# Patient Record
Sex: Male | Born: 1995 | Race: White | Hispanic: No | Marital: Single | Smoking: Current every day smoker
Health system: Southern US, Community
[De-identification: ages and names within clinical notes are randomized; demographics above are authoritative.]

---
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
## 2014-04-14 NOTE — ED Provider Notes (Signed)
[facility]  [facility]ER[facility] TREATMENT REPORT  NAME:  Jacob Waller, Jacob Waller  SEX:   M  ADMIT: 04/12/2014  DOB:   [birth year]  MR#    [id number]  ROOM:    TIME DICTATED: 01 03 AM  ACCT#  [id number]        TIME OF EVALUATION:  2030.    CHIEF COMPLAINT:  Right shoulder pain.    HISTORY OF PRESENT ILLNESS:  A 18 year old right-hand-dominant male complaining of right shoulder pain.  At  approximately 1630 today he accidentally slipped and fell into a 5-foot hole  in his friend's yard.  Caught his weight on the under aspect of his right arm  which hit the edge of the ground which forcefully jerked his right shoulder  upwards.  There was immediate onset of 8 out of 10, constant, throbbing pain  that radiates generally throughout the arm.  Significant increase in pain when  he attempts any movement.  Therefore, he has held it still in a slightly  abducted and flexed position as this is position of comfort.  Denies any other  injury.  Denies paresthesias.  He did have arthroscopic surgery to the right  shoulder 5 years ago after a snowboarding accident.  He is unsure exactly what  was injured, but believes he has 3 screws in his shoulder.    REVIEW OF SYSTEMS:  MUSCULOSKELETAL:  As above.  No neck or back pain.  No other joint pain.  SKIN:  No lacerations or abrasions.  NEUROLOGIC:  No sensory or motor symptoms.    PAST MEDICAL HISTORY:  Immunizations up to date, ADD.    SOCIAL HISTORY:  Tobacco use.  Denies alcohol and recreational drug use.    ALLERGIES:  NONE.    MEDICATIONS:  None.    PHYSICAL EXAMINATION:  VITAL SIGNS:  BP 142/86, pulse 78, respirations 18, temperature 98.2, pain 10,  O2 sat 98% on room air.  GENERAL APPEARANCE:  Looks very cold uncomfortable in the room due to pain.  He has tears in his loss.  MUSCULOSKELETAL:  Holding right arm slightly abducted and flexed away from his  body.  He will not actively move the shoulder or allow passive movement as   this greatly increases his shoulder pain.  There are no visual deformities to  the extremity.  No edema or erythema.  No palpable deformity at the shoulder  joint.  Mild tenderness along the lateral aspect of the clavicle over the [facility]  joint and proximal humerus.  No tenderness along body or spine of the scapula,  distal aspect of the humerus, elbow, wrist or hand.  There is no vertebral  bony tenderness.  No tenderness to the thorax or other extremities.   SKIN:  Right upper extremity warm and dry.  There are old surgical scars  present all over the right shoulder.  NEUROLOGIC:  Light sensation throughout dermatomes of right upper extremity  symmetrical when compared to the left.  Grip strength symmetric, 5/5  bilaterally.  Radial pulses 2+ and equal bilaterally.  Nailbeds of the hand  are pink with prompt capillary refill.    ASSESSMENT AND MANAGEMENT PLAN:  A patient with right shoulder pain.  The extremity is neurovascularly  x-ray.  Will obtain x-ray, consider fracture, AC separation, dislocation, rotator cuff  injury/tear.    DIAGNOSTIC INTERPRETATIONS:  Right shoulder x-ray read by Dr. Arvella MerlesKisa as unremarkable.    COURSE IN THE EMERGENCY DEPARTMENT:  The patient was medicated for pain with 8  mg of morphine IV push along with 4  mg of Zofran IV push for any nausea.  Afterwards was feeling much better, was  seated comfortably in the room with arm resting comfortably at his side.  I  was now able to passively internally and externally rotate the shoulder from 0  to 90 degrees with minimal discomfort.  Still has significant flexion and  abduction limited around 15 degrees due to pain.  Results discussed.  There is  no fracture, but we do suspect he has a rotator cuff injury.  Therefore, will  need to follow up with orthopedist for further evaluation and management.  May  need further evaluation with an MRI.  We did not see any hardware in his  shoulder.  The patient was very surprised as he had been told for several   years by his mom that with the last surgery screws had been placed.  Sling  provided to help rest the joint, but he is instructed to remove the arm from  sling 4 to 5 times daily for gentle range of motion exercises including  pendulum.  Range of motion exercises were reviewed personally by me.  This is  to be done to prevent a frozen shoulder.  We will discharge with ibuprofen and  Norco prescriptions for pain and inflammation.  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  He is happy and agreeable  with this plan.    FINAL DIAGNOSIS:  Shoulder strain.    DISPOSITION AND PLAN:  The patient discharged home in stable condition with verbal and written  instructions for ongoing care.  Referral to orthopedist, Dr. Caryn Section, given.  He  was warned not to drive or operate machinery while medicated on Norco.  He is  aware he may return at any time for new or worsening symptoms.  The patient  was personally evaluated by myself and Dr. Arvella Merles who agrees with the above  assessment and plan.      ___________________  Smitty Cords MD  Dictated By: Verlin Grills, PA    My signature above authenticates this document and my orders, the final  diagnosis (es), discharge prescription (s), and instructions in the [facility] record.  Nursing notes have been reviewed by the physician/mid-level provider.    If you have any questions please contact [phone].    SC  D:04/14/2014 01:03:51  T: 04/14/2014 11:22:04  [id number]  Electronically Authenticated by:  Smitty Cords, M.D. On 04/18/2014 03:34 PM EDT

## 2016-04-23 ENCOUNTER — Emergency Department (HOSPITAL_COMMUNITY)
Admission: EM | Admit: 2016-04-23 | Discharge: 2016-04-23 | Disposition: A | Discharge status: Home / Self Care | Payer: Managed Care, Other (non HMO) | Attending: Emergency Medicine | Admitting: Emergency Medicine

## 2016-04-23 ENCOUNTER — Emergency Department (HOSPITAL_COMMUNITY): Payer: Managed Care, Other (non HMO)

## 2016-04-23 ENCOUNTER — Encounter (HOSPITAL_COMMUNITY): Payer: Self-pay | Admitting: *Deleted

## 2016-04-23 DIAGNOSIS — M7989 Other specified soft tissue disorders: Secondary | ICD-10-CM | POA: Diagnosis present

## 2016-04-23 DIAGNOSIS — Y829 Unspecified medical devices associated with adverse incidents: Secondary | ICD-10-CM | POA: Diagnosis not present

## 2016-04-23 DIAGNOSIS — F17200 Nicotine dependence, unspecified, uncomplicated: Secondary | ICD-10-CM | POA: Diagnosis not present

## 2016-04-23 DIAGNOSIS — R609 Edema, unspecified: Secondary | ICD-10-CM

## 2016-04-23 DIAGNOSIS — T81718A Complication of other artery following a procedure, not elsewhere classified, initial encounter: Secondary | ICD-10-CM | POA: Diagnosis not present

## 2016-04-23 DIAGNOSIS — I724 Aneurysm of artery of lower extremity: Secondary | ICD-10-CM

## 2016-04-23 LAB — BASIC METABOLIC PANEL
ANION GAP: 9 (ref 5–15)
BUN: 7 mg/dL (ref 6–20)
CALCIUM: 9.3 mg/dL (ref 8.9–10.3)
CO2: 23 mmol/L (ref 22–32)
CREATININE: 0.96 mg/dL (ref 0.61–1.24)
Chloride: 105 mmol/L (ref 101–111)
GFR calc Af Amer: >60 mL/min (ref >60)
GLUCOSE: 107 mg/dL — AB (ref 65–99)
Potassium: 3.8 mmol/L (ref 3.5–5.1)
Sodium: 137 mmol/L (ref 135–145)

## 2016-04-23 LAB — CBC WITH DIFFERENTIAL/PLATELET
BASOS ABS: 0 10*3/uL (ref 0.0–0.1)
BASOS PCT: 0 %
EOS ABS: 0.2 10*3/uL (ref 0.0–0.7)
EOS PCT: 2 %
HCT: 47.3 % (ref 39.0–52.0)
Hemoglobin: 16.2 g/dL (ref 13.0–17.0)
Lymphocytes Relative: 38 %
Lymphs Abs: 5 10*3/uL — ABNORMAL HIGH (ref 0.7–4.0)
MCH: 29.4 pg (ref 26.0–34.0)
MCHC: 34.2 g/dL (ref 30.0–36.0)
MCV: 85.8 fL (ref 78.0–100.0)
MONO ABS: 0.8 10*3/uL (ref 0.1–1.0)
MONOS PCT: 6 %
Neutro Abs: 7.1 10*3/uL (ref 1.7–7.7)
Neutrophils Relative %: 54 %
PLATELETS: 223 10*3/uL (ref 150–400)
RBC: 5.51 MIL/uL (ref 4.22–5.81)
RDW: 15.4 % (ref 11.5–15.5)
WBC: 13.1 10*3/uL — ABNORMAL HIGH (ref 4.0–10.5)

## 2016-04-23 MED ORDER — IOPAMIDOL (ISOVUE-370) INJECTION 76%
100.0000 mL | Freq: Once | INTRAVENOUS | Status: AC | PRN
  Administered 2016-04-23: 100 mL via INTRAVENOUS
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
## 2016-04-23 NOTE — ED Notes (Signed)
Made CT aware of patient requesting copy of CT scan.

## 2016-04-23 NOTE — ED Notes (Signed)
Pt transported to CT ?

## 2016-04-23 NOTE — ED Notes (Signed)
MD at bedside. 
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
## 2016-04-23 NOTE — Discharge Instructions (Signed)
Follow up with your surgeon
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
